# Patient Record
Sex: Male | Born: 1970 | Race: Black or African American | Hispanic: No | Marital: Single | Smoking: Current every day smoker
Health system: Southern US, Community
[De-identification: ages and names within clinical notes are randomized; demographics above are authoritative.]

---

## 1997-12-25 ENCOUNTER — Emergency Department (HOSPITAL_COMMUNITY): Admission: EM | Admit: 1997-12-25 | Discharge: 1997-12-25 | Payer: Self-pay | Admitting: Emergency Medicine

## 2014-11-12 ENCOUNTER — Emergency Department (INDEPENDENT_AMBULATORY_CARE_PROVIDER_SITE_OTHER)
Admission: EM | Admit: 2014-11-12 | Discharge: 2014-11-12 | Disposition: A | Discharge status: Home / Self Care | Payer: Self-pay | Source: Home / Self Care | Attending: Family Medicine | Admitting: Family Medicine

## 2014-11-12 ENCOUNTER — Encounter (HOSPITAL_COMMUNITY): Payer: Self-pay | Admitting: *Deleted

## 2014-11-12 DIAGNOSIS — M5431 Sciatica, right side: Secondary | ICD-10-CM

## 2014-11-12 DIAGNOSIS — M62830 Muscle spasm of back: Secondary | ICD-10-CM

## 2014-11-12 MED ORDER — METHOCARBAMOL 500 MG PO TABS: 500.0000 mg | ORAL_TABLET | Freq: Four times a day (QID) | ORAL | Status: DC | PRN

## 2014-11-12 MED ORDER — PREDNISONE 20 MG PO TABS
50.0000 mg | ORAL_TABLET | Freq: Once | ORAL | Status: AC
  Administered 2014-11-12: 50 mg via ORAL

## 2014-11-12 MED ORDER — PREDNISONE 10 MG PO TABS
ORAL_TABLET | ORAL | Status: AC
  Filled 2014-11-12: qty 1

## 2014-11-12 MED ORDER — HYDROCODONE-ACETAMINOPHEN 5-325 MG PO TABS
1.0000 | ORAL_TABLET | Freq: Once | ORAL | Status: AC
  Administered 2014-11-12: 1 via ORAL

## 2014-11-12 MED ORDER — PREDNISONE 10 MG PO KIT: PACK | ORAL | Status: DC

## 2014-11-12 MED ORDER — TRAMADOL HCL 50 MG PO TABS: 100.0000 mg | ORAL_TABLET | Freq: Once | ORAL | Status: DC

## 2014-11-12 MED ORDER — PREDNISONE 20 MG PO TABS
ORAL_TABLET | ORAL | Status: AC
  Filled 2014-11-12: qty 2

## 2014-11-12 MED ORDER — HYDROCODONE-ACETAMINOPHEN 5-325 MG PO TABS
ORAL_TABLET | ORAL | Status: AC
  Filled 2014-11-12: qty 1

## 2014-11-12 NOTE — ED Provider Notes (Signed)
CSN: [id number]     Arrival date & time 11/12/14  1837 History   First MD Initiated Contact with Patient 11/12/14 1907     Chief Complaint  Patient presents with  . Back Pain   (Consider location/radiation/quality/duration/timing/severity/associated sxs/prior Treatment) HPI  Back pain: started 4 days ago. Getting worse. Tight feeling. Lower back. bilat R>L. Aleve 4 daily w/ minimal improvement. No changei n exercise routine or recent heavy lifting. Pain is constant. Worse w/ certain movements. Denies loss of bowel or bladder fxn. Mild R leg weakness.  Denies fevers, chest pain, shortness of breath, palpitation, nausea, vomiting, diarrhea, abdominal pain, dysuria, frequency, falls, loss of consciousness, headache.  History reviewed. No pertinent past medical history. History reviewed. No pertinent past surgical history. Family History  Problem Relation Age of Onset  . Asthma Mother   . Heart disease Father    History  Substance Use Topics  . Smoking status: Current Every Day Smoker -- 0.75 packs/day    Types: Cigarettes  . Smokeless tobacco: Not on file  . Alcohol Use: Yes     Comment: socially    Review of Systems Per HPI with all other pertinent systems negative.   Allergies  Review of patient's allergies indicates no known allergies.  Home Medications   Prior to Admission medications   Medication Sig Start Date End Date Taking? Authorizing Provider  naproxen sodium (ANAPROX) 220 MG tablet Take 440 mg by mouth 2 (two) times daily with a meal.   Yes Historical Provider, MD  methocarbamol (ROBAXIN) 500 MG tablet Take 1-2 tablets (500-1,000 mg total) by mouth every 6 (six) hours as needed for muscle spasms. 11/12/14   Waldemar Dickens, MD  PredniSONE 10 MG KIT 12 day dose pack 11/12/14   Waldemar Dickens, MD   BP 123/83 mmHg  Pulse 74  Temp(Src) 98.3 F (36.8 C) (Oral)  Resp 16  SpO2 99% Physical Exam Physical Exam  Constitutional: oriented to person, place, and time.  appears well-developed and well-nourished. No distress.  HENT:  Head: Normocephalic and atraumatic.  Eyes: EOMI. PERRL.  Neck: Normal range of motion.  Cardiovascular: RRR, no m/r/g, 2+ distal pulses,  Pulmonary/Chest: Effort normal and breath sounds normal. No respiratory distress.  Abdominal: Soft. Bowel sounds are normal. NonTTP, no distension.  Musculoskeletal: Back FROM. R and L perispinal muscle tightness and ttp.  Neurological: alert and oriented to person, place, and time.  Skin: Skin is warm. No rash noted. non diaphoretic.  Psychiatric: normal mood and affect. behavior is normal. Judgment and thought content normal.   ED Course  Procedures (including critical care time) Labs Review Labs Reviewed - No data to display  Imaging Review No results found.   MDM   1. Back spasm   2. Sciatica, right    Bactrim 5 through 25 and penicillin 50 mg given in office. Start sciatica exercises, prednisone Dosepak, heat, massage. Patient aware that if he gets significantly worse he is scheduled to the emergency room immediately. Patient follow-up with his primary care physician if he is not better in 2 weeks for further management.   Waldemar Dickens, MD 11/12/14 [phone]

## 2014-11-12 NOTE — Discharge Instructions (Signed)
You likely have sciatica. This will require exercises, time and anti-inflammatory medications to resolve.  Please go to the emergency room if you get significantly worse Please follow up with your regular doctor in 2 weeks if you are not better.  Please take the other medications as prescribed.   Sciatica with Rehab The sciatic nerve runs from the back down the leg and is responsible for sensation and control of the muscles in the back (posterior) side of the thigh, lower leg, and foot. Sciatica is a condition that is characterized by inflammation of this nerve.  SYMPTOMS   Signs of nerve damage, including numbness and/or weakness along the posterior side of the lower extremity.  Pain in the back of the thigh that may also travel down the leg.  Pain that worsens when sitting for long periods of time.  Occasionally, pain in the back or buttock. CAUSES  Inflammation of the sciatic nerve is the cause of sciatica. The inflammation is due to something irritating the nerve. Common sources of irritation include:  Sitting for long periods of time.  Direct trauma to the nerve.  Arthritis of the spine.  Herniated or ruptured disk.  Slipping of the vertebrae (spondylolisthesis).  Pressure from soft tissues, such as muscles or ligament-like tissue (fascia). RISK INCREASES WITH:  Sports that place pressure or stress on the spine (football or weightlifting).  Poor strength and flexibility.  Failure to warm up properly before activity.  Family history of low back pain or disk disorders.  Previous back injury or surgery.  Poor body mechanics, especially when lifting, or poor posture. PREVENTION   Warm up and stretch properly before activity.  Maintain physical fitness:  Strength, flexibility, and endurance.  Cardiovascular fitness.  Learn and use proper technique, especially with posture and lifting. When possible, have coach correct improper technique.  Avoid activities that  place stress on the spine. PROGNOSIS If treated properly, then sciatica usually resolves within 6 weeks. However, occasionally surgery is necessary.  RELATED COMPLICATIONS   Permanent nerve damage, including pain, numbness, tingle, or weakness.  Chronic back pain.  Risks of surgery: infection, bleeding, nerve damage, or damage to surrounding tissues. TREATMENT Treatment initially involves resting from any activities that aggravate your symptoms. The use of ice and medication may help reduce pain and inflammation. The use of strengthening and stretching exercises may help reduce pain with activity. These exercises may be performed at home or with referral to a therapist. A therapist may recommend further treatments, such as transcutaneous electronic nerve stimulation (TENS) or ultrasound. Your caregiver may recommend corticosteroid injections to help reduce inflammation of the sciatic nerve. If symptoms persist despite non-surgical (conservative) treatment, then surgery may be recommended. MEDICATION  If pain medication is necessary, then nonsteroidal anti-inflammatory medications, such as aspirin and ibuprofen, or other minor pain relievers, such as acetaminophen, are often recommended.  Do not take pain medication for 7 days before surgery.  Prescription pain relievers may be given if deemed necessary by your caregiver. Use only as directed and only as much as you need.  Ointments applied to the skin may be helpful.  Corticosteroid injections may be given by your caregiver. These injections should be reserved for the most serious cases, because they may only be given a certain number of times. HEAT AND COLD  Cold treatment (icing) relieves pain and reduces inflammation. Cold treatment should be applied for 10 to 15 minutes every 2 to 3 hours for inflammation and pain and immediately after any activity that  aggravates your symptoms. Use ice packs or massage the area with a piece of ice (ice  massage).  Heat treatment may be used prior to performing the stretching and strengthening activities prescribed by your caregiver, physical therapist, or athletic trainer. Use a heat pack or soak the injury in warm water. SEEK MEDICAL CARE IF:  Treatment seems to offer no benefit, or the condition worsens.  Any medications produce adverse side effects. EXERCISES  RANGE OF MOTION (ROM) AND STRETCHING EXERCISES - Sciatica Most people with sciatic will find that their symptoms worsen with either excessive bending forward (flexion) or arching at the low back (extension). The exercises which will help resolve your symptoms will focus on the opposite motion. Your physician, physical therapist or athletic trainer will help you determine which exercises will be most helpful to resolve your low back pain. Do not complete any exercises without first consulting with your clinician. Discontinue any exercises which worsen your symptoms until you speak to your clinician. If you have pain, numbness or tingling which travels down into your buttocks, leg or foot, the goal of the therapy is for these symptoms to move closer to your back and eventually resolve. Occasionally, these leg symptoms will get better, but your low back pain may worsen; this is typically an indication of progress in your rehabilitation. Be certain to be very alert to any changes in your symptoms and the activities in which you participated in the 24 hours prior to the change. Sharing this information with your clinician will allow him/her to most efficiently treat your condition. These exercises may help you when beginning to rehabilitate your injury. Your symptoms may resolve with or without further involvement from your physician, physical therapist or athletic trainer. While completing these exercises, remember:   Restoring tissue flexibility helps normal motion to return to the joints. This allows healthier, less painful movement and  activity.  An effective stretch should be held for at least 30 seconds.  A stretch should never be painful. You should only feel a gentle lengthening or release in the stretched tissue. FLEXION RANGE OF MOTION AND STRETCHING EXERCISES: STRETCH - Flexion, Single Knee to Chest   Lie on a firm bed or floor with both legs extended in front of you.  Keeping one leg in contact with the floor, bring your opposite knee to your chest. Hold your leg in place by either grabbing behind your thigh or at your knee.  Pull until you feel a gentle stretch in your low back. Hold __________ seconds.  Slowly release your grasp and repeat the exercise with the opposite side. Repeat __________ times. Complete this exercise __________ times per day.  STRETCH - Flexion, Double Knee to Chest  Lie on a firm bed or floor with both legs extended in front of you.  Keeping one leg in contact with the floor, bring your opposite knee to your chest.  Tense your stomach muscles to support your back and then lift your other knee to your chest. Hold your legs in place by either grabbing behind your thighs or at your knees.  Pull both knees toward your chest until you feel a gentle stretch in your low back. Hold __________ seconds.  Tense your stomach muscles and slowly return one leg at a time to the floor. Repeat __________ times. Complete this exercise __________ times per day.  STRETCH - Low Trunk Rotation   Lie on a firm bed or floor. Keeping your legs in front of you, bend your knees  so they are both pointed toward the ceiling and your feet are flat on the floor.  Extend your arms out to the side. This will stabilize your upper body by keeping your shoulders in contact with the floor.  Gently and slowly drop both knees together to one side until you feel a gentle stretch in your low back. Hold for __________ seconds.  Tense your stomach muscles to support your low back as you bring your knees back to the  starting position. Repeat the exercise to the other side. Repeat __________ times. Complete this exercise __________ times per day  EXTENSION RANGE OF MOTION AND FLEXIBILITY EXERCISES: STRETCH - Extension, Prone on Elbows  Lie on your stomach on the floor, a bed will be too soft. Place your palms about shoulder width apart and at the height of your head.  Place your elbows under your shoulders. If this is too painful, stack pillows under your chest.  Allow your body to relax so that your hips drop lower and make contact more completely with the floor.  Hold this position for __________ seconds.  Slowly return to lying flat on the floor. Repeat __________ times. Complete this exercise __________ times per day.  RANGE OF MOTION - Extension, Prone Press Ups  Lie on your stomach on the floor, a bed will be too soft. Place your palms about shoulder width apart and at the height of your head.  Keeping your back as relaxed as possible, slowly straighten your elbows while keeping your hips on the floor. You may adjust the placement of your hands to maximize your comfort. As you gain motion, your hands will come more underneath your shoulders.  Hold this position __________ seconds.  Slowly return to lying flat on the floor. Repeat __________ times. Complete this exercise __________ times per day.  STRENGTHENING EXERCISES - Sciatica  These exercises may help you when beginning to rehabilitate your injury. These exercises should be done near your "sweet spot." This is the neutral, low-back arch, somewhere between fully rounded and fully arched, that is your least painful position. When performed in this safe range of motion, these exercises can be used for people who have either a flexion or extension based injury. These exercises may resolve your symptoms with or without further involvement from your physician, physical therapist or athletic trainer. While completing these exercises, remember:    Muscles can gain both the endurance and the strength needed for everyday activities through controlled exercises.  Complete these exercises as instructed by your physician, physical therapist or athletic trainer. Progress with the resistance and repetition exercises only as your caregiver advises.  You may experience muscle soreness or fatigue, but the pain or discomfort you are trying to eliminate should never worsen during these exercises. If this pain does worsen, stop and make certain you are following the directions exactly. If the pain is still present after adjustments, discontinue the exercise until you can discuss the trouble with your clinician. STRENGTHENING - Deep Abdominals, Pelvic Tilt   Lie on a firm bed or floor. Keeping your legs in front of you, bend your knees so they are both pointed toward the ceiling and your feet are flat on the floor.  Tense your lower abdominal muscles to press your low back into the floor. This motion will rotate your pelvis so that your tail bone is scooping upwards rather than pointing at your feet or into the floor.  With a gentle tension and even breathing, hold this position for  __________ seconds. Repeat __________ times. Complete this exercise __________ times per day.  STRENGTHENING - Abdominals, Crunches   Lie on a firm bed or floor. Keeping your legs in front of you, bend your knees so they are both pointed toward the ceiling and your feet are flat on the floor. Cross your arms over your chest.  Slightly tip your chin down without bending your neck.  Tense your abdominals and slowly lift your trunk high enough to just clear your shoulder blades. Lifting higher can put excessive stress on the low back and does not further strengthen your abdominal muscles.  Control your return to the starting position. Repeat __________ times. Complete this exercise __________ times per day.  STRENGTHENING - Quadruped, Opposite UE/LE Lift  Assume a  hands and knees position on a firm surface. Keep your hands under your shoulders and your knees under your hips. You may place padding under your knees for comfort.  Find your neutral spine and gently tense your abdominal muscles so that you can maintain this position. Your shoulders and hips should form a rectangle that is parallel with the floor and is not twisted.  Keeping your trunk steady, lift your right hand no higher than your shoulder and then your left leg no higher than your hip. Make sure you are not holding your breath. Hold this position __________ seconds.  Continuing to keep your abdominal muscles tense and your back steady, slowly return to your starting position. Repeat with the opposite arm and leg. Repeat __________ times. Complete this exercise __________ times per day.  STRENGTHENING - Abdominals and Quadriceps, Straight Leg Raise   Lie on a firm bed or floor with both legs extended in front of you.  Keeping one leg in contact with the floor, bend the other knee so that your foot can rest flat on the floor.  Find your neutral spine, and tense your abdominal muscles to maintain your spinal position throughout the exercise.  Slowly lift your straight leg off the floor about 6 inches for a count of 15, making sure to not hold your breath.  Still keeping your neutral spine, slowly lower your leg all the way to the floor. Repeat this exercise with each leg __________ times. Complete this exercise __________ times per day. POSTURE AND BODY MECHANICS CONSIDERATIONS - Sciatica Keeping correct posture when sitting, standing or completing your activities will reduce the stress put on different body tissues, allowing injured tissues a chance to heal and limiting painful experiences. The following are general guidelines for improved posture. Your physician or physical therapist will provide you with any instructions specific to your needs. While reading these guidelines,  remember:  The exercises prescribed by your provider will help you have the flexibility and strength to maintain correct postures.  The correct posture provides the optimal environment for your joints to work. All of your joints have less wear and tear when properly supported by a spine with good posture. This means you will experience a healthier, less painful body.  Correct posture must be practiced with all of your activities, especially prolonged sitting and standing. Correct posture is as important when doing repetitive low-stress activities (typing) as it is when doing a single heavy-load activity (lifting). RESTING POSITIONS Consider which positions are most painful for you when choosing a resting position. If you have pain with flexion-based activities (sitting, bending, stooping, squatting), choose a position that allows you to rest in a less flexed posture. You would want to avoid curling into  a fetal position on your side. If your pain worsens with extension-based activities (prolonged standing, working overhead), avoid resting in an extended position such as sleeping on your stomach. Most people will find more comfort when they rest with their spine in a more neutral position, neither too rounded nor too arched. Lying on a non-sagging bed on your side with a pillow between your knees, or on your back with a pillow under your knees will often provide some relief. Keep in mind, being in any one position for a prolonged period of time, no matter how correct your posture, can still lead to stiffness. PROPER SITTING POSTURE In order to minimize stress and discomfort on your spine, you must sit with correct posture Sitting with good posture should be effortless for a healthy body. Returning to good posture is a gradual process. Many people can work toward this most comfortably by using various supports until they have the flexibility and strength to maintain this posture on their own. When sitting  with proper posture, your ears will fall over your shoulders and your shoulders will fall over your hips. You should use the back of the chair to support your upper back. Your low back will be in a neutral position, just slightly arched. You may place a small pillow or folded towel at the base of your low back for support.  When working at a desk, create an environment that supports good, upright posture. Without extra support, muscles fatigue and lead to excessive strain on joints and other tissues. Keep these recommendations in mind: CHAIR:   A chair should be able to slide under your desk when your back makes contact with the back of the chair. This allows you to work closely.  The chair's height should allow your eyes to be level with the upper part of your monitor and your hands to be slightly lower than your elbows. BODY POSITION  Your feet should make contact with the floor. If this is not possible, use a foot rest.  Keep your ears over your shoulders. This will reduce stress on your neck and low back. INCORRECT SITTING POSTURES   If you are feeling tired and unable to assume a healthy sitting posture, do not slouch or slump. This puts excessive strain on your back tissues, causing more damage and pain. Healthier options include:  Using more support, like a lumbar pillow.  Switching tasks to something that requires you to be upright or walking.  Talking a brief walk.  Lying down to rest in a neutral-spine position. PROLONGED STANDING WHILE SLIGHTLY LEANING FORWARD  When completing a task that requires you to lean forward while standing in one place for a long time, place either foot up on a stationary 2-4 inch high object to help maintain the best posture. When both feet are on the ground, the low back tends to lose its slight inward curve. If this curve flattens (or becomes too large), then the back and your other joints will experience too much stress, fatigue more quickly and can  cause pain.  CORRECT STANDING POSTURES Proper standing posture should be assumed with all daily activities, even if they only take a few moments, like when brushing your teeth. As in sitting, your ears should fall over your shoulders and your shoulders should fall over your hips. You should keep a slight tension in your abdominal muscles to brace your spine. Your tailbone should point down to the ground, not behind your body, resulting in an over-extended  swayback posture.  INCORRECT STANDING POSTURES  Common incorrect standing postures include a forward head, locked knees and/or an excessive swayback. WALKING Walk with an upright posture. Your ears, shoulders and hips should all line-up. PROLONGED ACTIVITY IN A FLEXED POSITION When completing a task that requires you to bend forward at your waist or lean over a low surface, try to find a way to stabilize 3 of 4 of your limbs. You can place a hand or elbow on your thigh or rest a knee on the surface you are reaching across. This will provide you more stability so that your muscles do not fatigue as quickly. By keeping your knees relaxed, or slightly bent, you will also reduce stress across your low back. CORRECT LIFTING TECHNIQUES DO :   Assume a wide stance. This will provide you more stability and the opportunity to get as close as possible to the object which you are lifting.  Tense your abdominals to brace your spine; then bend at the knees and hips. Keeping your back locked in a neutral-spine position, lift using your leg muscles. Lift with your legs, keeping your back straight.  Test the weight of unknown objects before attempting to lift them.  Try to keep your elbows locked down at your sides in order get the best strength from your shoulders when carrying an object.  Always ask for help when lifting heavy or awkward objects. INCORRECT LIFTING TECHNIQUES DO NOT:   Lock your knees when lifting, even if it is a small object.  Bend and  twist. Pivot at your feet or move your feet when needing to change directions.  Assume that you cannot safely pick up a paperclip without proper posture. Document Released: 07/21/2005 Document Revised: 12/05/2013 Document Reviewed: 11/02/2008 [facility] Patient Information 2015 [place], [place]. This information is not intended to replace advice given to you by your health care provider. Make sure you discuss any questions you have with your health care provider.

## 2014-11-12 NOTE — ED Notes (Addendum)
Woke Wed morning with low back pain.  He worked on [address] [employer]. Pain was worse on Friday and felt like he could not get his breath and felt like he was going vomit.  He said he could not get up.  He tried rest, Aleve and hot and cold compresses.

## 2014-11-12 NOTE — ED Notes (Signed)
Work note done as Dr. Konrad DoloresMerrell said and given to pt.

## 2018-12-26 ENCOUNTER — Emergency Department (HOSPITAL_COMMUNITY)
Admission: EM | Admit: 2018-12-26 | Discharge: 2018-12-26 | Disposition: A | Discharge status: Home / Self Care | Payer: Self-pay | Attending: Emergency Medicine | Admitting: Emergency Medicine

## 2018-12-26 ENCOUNTER — Other Ambulatory Visit: Payer: Self-pay

## 2018-12-26 ENCOUNTER — Encounter (HOSPITAL_COMMUNITY): Payer: Self-pay | Admitting: Emergency Medicine

## 2018-12-26 DIAGNOSIS — F17210 Nicotine dependence, cigarettes, uncomplicated: Secondary | ICD-10-CM | POA: Insufficient documentation

## 2018-12-26 DIAGNOSIS — Z79899 Other long term (current) drug therapy: Secondary | ICD-10-CM | POA: Insufficient documentation

## 2018-12-26 DIAGNOSIS — M62830 Muscle spasm of back: Secondary | ICD-10-CM | POA: Insufficient documentation

## 2018-12-26 DIAGNOSIS — M545 Low back pain: Secondary | ICD-10-CM | POA: Insufficient documentation

## 2018-12-26 DIAGNOSIS — M79604 Pain in right leg: Secondary | ICD-10-CM

## 2018-12-26 MED ORDER — METHOCARBAMOL 500 MG PO TABS: 500.0000 mg | ORAL_TABLET | Freq: Two times a day (BID) | ORAL | 0 refills | Status: DC

## 2018-12-26 MED ORDER — NAPROXEN 500 MG PO TABS: 500.0000 mg | ORAL_TABLET | Freq: Two times a day (BID) | ORAL | 0 refills | Status: DC

## 2018-12-26 MED ORDER — KETOROLAC TROMETHAMINE 60 MG/2ML IM SOLN
60.0000 mg | Freq: Once | INTRAMUSCULAR | Status: AC
Start: 2018-12-26 — End: 2018-12-26
  Administered 2018-12-26: 60 mg via INTRAMUSCULAR
  Filled 2018-12-26: qty 2

## 2018-12-26 MED ORDER — METHOCARBAMOL 500 MG PO TABS
500.0000 mg | ORAL_TABLET | Freq: Once | ORAL | Status: AC
  Administered 2018-12-26: 500 mg via ORAL
  Filled 2018-12-26: qty 1

## 2018-12-26 MED ORDER — OXYCODONE-ACETAMINOPHEN 5-325 MG PO TABS
1.0000 | ORAL_TABLET | Freq: Once | ORAL | Status: AC
  Administered 2018-12-26: 04:00:00 1 via ORAL
  Filled 2018-12-26: qty 1

## 2018-12-26 NOTE — Discharge Instructions (Signed)
Take the prescribed medication as directed.  Can use heat on the back to help with tension as well. Follow-up with your primary care doctor. Return to the ED for new or worsening symptoms.

## 2018-12-26 NOTE — ED Provider Notes (Signed)
[facility] DEPT Provider Note   CSN: [id number] Arrival date & time: 12/26/18  0319    History   Chief Complaint Chief Complaint  Patient presents with  . Back Pain    HPI John Sexton is a 48 y.o. male.     The history is provided by the patient and medical records.     49 year old male here with right lower back pain.  Reports it feels tight with "spasms".  He reports some radiation of pain into his hip pain groin.  He denies any injury, trauma, or falls.  He reports similar episode 4 years ago but generally does not have any issues with his back.  He denies any numbness or weakness of the legs.  No bowel or bladder incontinence.  He did take Aleve at home with no improvement.  History reviewed. No pertinent past medical history.  There are no active problems to display for this patient.   History reviewed. No pertinent surgical history.      Home Medications    Prior to Admission medications   Medication Sig Start Date End Date Taking? Authorizing Provider  methocarbamol (ROBAXIN) 500 MG tablet Take 1-2 tablets (500-1,000 mg total) by mouth every 6 (six) hours as needed for muscle spasms. 11/12/14   Waldemar Dickens, MD  naproxen sodium (ANAPROX) 220 MG tablet Take 440 mg by mouth 2 (two) times daily with a meal.    [provider]  PredniSONE 10 MG KIT 12 day dose pack 11/12/14   Waldemar Dickens, MD    Family History Family History  Problem Relation Age of Onset  . Asthma Mother   . Heart disease Father     Social History Social History   Tobacco Use  . Smoking status: Current Every Day Smoker    Packs/day: 1.00    Types: Cigarettes  . Smokeless tobacco: Never Used  Substance Use Topics  . Alcohol use: Yes    Comment: socially  . Drug use: Yes    Types: Methamphetamines, Marijuana     Allergies   Patient has no known allergies.   Review of Systems Review of Systems  Musculoskeletal: Positive for back  pain.  All other systems reviewed and are negative.    Physical Exam Updated Vital Signs BP (!) 132/92 (BP Location: Right Arm)   Pulse 63   Temp 98.3 F (36.8 C) (Oral)   Resp 15   Ht 5' 6"  (1.676 m)   Wt 63.5 kg   SpO2 96%   BMI 22.60 kg/m   Physical Exam Vitals signs and nursing note reviewed.  Constitutional:      Appearance: He is well-developed.     Comments: Appears uncomfortable  HENT:     Head: Normocephalic and atraumatic.  Eyes:     Conjunctiva/sclera: Conjunctivae normal.     Pupils: Pupils are equal, round, and reactive to light.  Neck:     Musculoskeletal: Normal range of motion.  Cardiovascular:     Rate and Rhythm: Normal rate and regular rhythm.     Heart sounds: Normal heart sounds.  Pulmonary:     Effort: Pulmonary effort is normal.     Breath sounds: Normal breath sounds.  Abdominal:     General: Bowel sounds are normal.     Palpations: Abdomen is soft.  Musculoskeletal: Normal range of motion.     Comments: Tenderness of right lumbar paraspinal musculature with spasm present, there is no midline step-off or deformity, pain with  movement/changing position; DP pulse intact bilaterally; moving toes normally, distal sensation intact  Skin:    General: Skin is warm and dry.  Neurological:     Mental Status: He is alert and oriented to person, place, and time.      ED Treatments / Results  Labs (all labs ordered are listed, but only abnormal results are displayed) Labs Reviewed - No data to display  EKG None  Radiology No results found.  Procedures Procedures (including critical care time)  Medications Ordered in ED Medications  ketorolac (TORADOL) injection 60 mg (60 mg Intramuscular Given 12/26/18 0404)  oxyCODONE-acetaminophen (PERCOCET/ROXICET) 5-325 MG per tablet 1 tablet (1 tablet Oral Given 12/26/18 0403)  methocarbamol (ROBAXIN) tablet 500 mg (500 mg Oral Given 12/26/18 0403)     Initial Impression / Assessment and Plan / ED  Course  I have reviewed the triage vital signs and the nursing notes.  Pertinent labs & imaging results that were available during my care of the patient were reviewed by me and considered in my medical decision making (see chart for details).  48 year old M here with right-sided low back pain.  No reported injury, trauma, or falls.  He does appear uncomfortable on exam.  Tenderness of the right lumbar paraspinal musculature with spasm present.  There is no midline step-off or deformity.  He is not have any focal neurologic deficits concerning for cauda equina.  Do not feel he needs emergent imaging at this time.  Will give analgesia and reassess.  4:49 AM Pain much improved at this time.  Patient able to lay on right side.  He states pain 3/10 compared to 9/10 when he arrived.  He feels comfortable and can rest at home.  Recommend rest, heat, meds PRN.  Can follow-up with PCP.  Work note given.  Return here for any new/acute changes.  Final Clinical Impressions(s) / ED Diagnoses   Final diagnoses:  Low back pain radiating to right lower extremity    ED Discharge Orders         Ordered    methocarbamol (ROBAXIN) 500 MG tablet  2 times daily     12/26/18 0450    naproxen (NAPROSYN) 500 MG tablet  2 times daily with meals     12/26/18 0450           Larene Pickett, PA-C 12/26/18 5859    Merryl Hacker, MD 12/26/18 [phone]

## 2018-12-26 NOTE — ED Triage Notes (Signed)
Pt reports lower back pain with radiation down right leg. Pt denies any injruy

## 2020-10-11 ENCOUNTER — Ambulatory Visit
Admission: EM | Admit: 2020-10-11 | Discharge: 2020-10-11 | Disposition: A | Discharge status: Home / Self Care | Payer: Self-pay | Attending: Emergency Medicine | Admitting: Emergency Medicine

## 2020-10-11 ENCOUNTER — Other Ambulatory Visit: Payer: Self-pay

## 2020-10-11 DIAGNOSIS — M545 Low back pain, unspecified: Secondary | ICD-10-CM

## 2020-10-11 MED ORDER — PREDNISONE 10 MG PO TABS: ORAL_TABLET | ORAL | 0 refills | Status: DC

## 2020-10-11 MED ORDER — TIZANIDINE HCL 4 MG PO TABS: 2.0000 mg | ORAL_TABLET | Freq: Four times a day (QID) | ORAL | 0 refills | Status: DC | PRN

## 2020-10-11 NOTE — ED Triage Notes (Signed)
Pt presents with lower back pain that comes and goes. Pt denies any injury. Reports the pain started Friday. Pt has history of back pain. Reports it has gotten so bad he could barely walk or stand. Denies relief with ibuprofen.

## 2020-10-11 NOTE — ED Provider Notes (Signed)
[name]    CSN: [id number] Arrival date & time: 10/11/20  1307      History   Chief Complaint Chief Complaint  Patient presents with  . Back Pain    HPI John Sexton is a 50 y.o. male presenting today for evaluation of back pain.  Reports pain started on Friday, present over the past 5 days.  Denies any injury or trauma.  Reports history of similar mild flareup at times.  Denies any radiation into extremities.  Denies numbness or tingling.  Denies urinary symptoms.  Using ibuprofen with out full relief.  HPI  History reviewed. No pertinent past medical history.  There are no problems to display for this patient.   History reviewed. No pertinent surgical history.     Home Medications    Prior to Admission medications   Medication Sig Start Date End Date Taking? Authorizing Provider  predniSONE (DELTASONE) 10 MG tablet Begin with 6 tabs on day 1, 5 tab on day 2, 4 tab on day 3, 3 tab on day 4, 2 tab on day 5, 1 tab on day 6-take with food 10/11/20  Yes Makaela Cando C, PA-C  tiZANidine (ZANAFLEX) 4 MG tablet Take 0.5-1 tablets (2-4 mg total) by mouth every 6 (six) hours as needed for muscle spasms. 10/11/20  Yes Layla Kesling, Junius Creamer, PA-C    Family History Family History  Problem Relation Age of Onset  . Asthma Mother   . Heart disease Father     Social History Social History   Tobacco Use  . Smoking status: Current Every Day Smoker    Packs/day: 1.00    Types: Cigarettes  . Smokeless tobacco: Never Used  Substance Use Topics  . Alcohol use: Yes    Comment: socially  . Drug use: Yes    Types: Methamphetamines, Marijuana     Allergies   Patient has no known allergies.   Review of Systems Review of Systems  Constitutional: Negative for fatigue and fever.  Eyes: Negative for redness, itching and visual disturbance.  Respiratory: Negative for shortness of breath.   Cardiovascular: Negative for chest pain and leg swelling.   Gastrointestinal: Negative for nausea and vomiting.  Musculoskeletal: Positive for back pain and myalgias. Negative for arthralgias.  Skin: Negative for color change, rash and wound.  Neurological: Negative for dizziness, syncope, weakness, light-headedness and headaches.     Physical Exam Triage Vital Signs ED Triage Vitals  Enc Vitals Group     BP 10/11/20 1322 (!) 142/98     Pulse Rate 10/11/20 1322 94     Resp 10/11/20 1322 19     Temp 10/11/20 1322 98.7 F (37.1 C)     Temp src --      SpO2 10/11/20 1322 100 %     Weight --      Height --      Head Circumference --      Peak Flow --      Pain Score 10/11/20 1321 9     Pain Loc --      Pain Edu? --      Excl. in GC? --    No data found.  Updated Vital Signs BP (!) 142/98   Pulse 94   Temp 98.7 F (37.1 C)   Resp 19   SpO2 100%   Visual Acuity Right Eye Distance:   Left Eye Distance:   Bilateral Distance:    Right Eye Near:   Left Eye Near:  Bilateral Near:     Physical Exam Vitals and nursing note reviewed.  Constitutional:      Appearance: He is well-developed.     Comments: No acute distress  HENT:     Head: Normocephalic and atraumatic.     Nose: Nose normal.  Eyes:     Conjunctiva/sclera: Conjunctivae normal.  Cardiovascular:     Rate and Rhythm: Normal rate.  Pulmonary:     Effort: Pulmonary effort is normal. No respiratory distress.  Abdominal:     General: There is no distension.  Musculoskeletal:        General: Normal range of motion.     Cervical back: Neck supple.     Comments: Back: Nontender to palpation along lumbar spine midline, tenderness to palpation along right lumbar musculature  Strength at hips knees 5/5 ankle bilaterally, does have some slight weakness 4/5 with resisted hip flexion on the right  Skin:    General: Skin is warm and dry.  Neurological:     Mental Status: He is alert and oriented to person, place, and time.      UC Treatments / Results  Labs (all  labs ordered are listed, but only abnormal results are displayed) Labs Reviewed - No data to display  EKG   Radiology No results found.  Procedures Procedures (including critical care time)  Medications Ordered in UC Medications - No data to display  Initial Impression / Assessment and Plan / UC Course  I have reviewed the triage vital signs and the nursing notes.  Pertinent labs & imaging results that were available during my care of the patient were reviewed by me and considered in my medical decision making (see chart for details).     Right low back pain-no maintenance of injury, deferring imaging.  Using NSAIDs without full relief, will trial prednisone taper and supplement with muscle relaxers, discussed activity modification.  Work note provided.  Discussed strict return precautions. Patient verbalized understanding and is agreeable with plan.  Final Clinical Impressions(s) / UC Diagnoses   Final diagnoses:  Acute right-sided low back pain without sciatica     Discharge Instructions     Begin prednisone taper over the next 6 days-begin with 6 tablets, decrease by 1 tablet each day until complete-6, 5, 4, 3, 2, 1-take with food and early in the day if possible  Supplement with tizanidine which is a muscle relaxer, may cause drowsiness, do not drive or work after taking  Alternate ice and heat  Follow-up if not improving or worsening    ED Prescriptions    Medication Sig Dispense Auth. Provider   predniSONE (DELTASONE) 10 MG tablet Begin with 6 tabs on day 1, 5 tab on day 2, 4 tab on day 3, 3 tab on day 4, 2 tab on day 5, 1 tab on day 6-take with food 21 tablet Grayland Daisey C, PA-C   tiZANidine (ZANAFLEX) 4 MG tablet Take 0.5-1 tablets (2-4 mg total) by mouth every 6 (six) hours as needed for muscle spasms. 30 tablet Lilyian Quayle, [place] C, PA-C     PDMP not reviewed this encounter.   Lew Dawes, [place] 10/11/20 1343

## 2020-10-11 NOTE — Discharge Instructions (Signed)
Begin prednisone taper over the next 6 days-begin with 6 tablets, decrease by 1 tablet each day until complete-6, 5, 4, 3, 2, 1-take with food and early in the day if possible  Supplement with tizanidine which is a muscle relaxer, may cause drowsiness, do not drive or work after taking  Alternate ice and heat  Follow-up if not improving or worsening

## 2021-01-28 ENCOUNTER — Emergency Department (HOSPITAL_COMMUNITY): Payer: Self-pay

## 2021-01-28 ENCOUNTER — Encounter (HOSPITAL_COMMUNITY): Payer: Self-pay | Admitting: Emergency Medicine

## 2021-01-28 ENCOUNTER — Other Ambulatory Visit: Payer: Self-pay

## 2021-01-28 ENCOUNTER — Emergency Department (HOSPITAL_COMMUNITY)
Admission: EM | Admit: 2021-01-28 | Discharge: 2021-01-28 | Disposition: A | Discharge status: Home / Self Care | Payer: Self-pay | Attending: Emergency Medicine | Admitting: Emergency Medicine

## 2021-01-28 DIAGNOSIS — S81051A Open bite, right knee, initial encounter: Secondary | ICD-10-CM | POA: Insufficient documentation

## 2021-01-28 DIAGNOSIS — W540XXA Bitten by dog, initial encounter: Secondary | ICD-10-CM | POA: Insufficient documentation

## 2021-01-28 DIAGNOSIS — Z23 Encounter for immunization: Secondary | ICD-10-CM | POA: Insufficient documentation

## 2021-01-28 DIAGNOSIS — F17210 Nicotine dependence, cigarettes, uncomplicated: Secondary | ICD-10-CM | POA: Insufficient documentation

## 2021-01-28 MED ORDER — AMOXICILLIN-POT CLAVULANATE 875-125 MG PO TABS: 1.0000 | ORAL_TABLET | Freq: Two times a day (BID) | ORAL | 0 refills | Status: DC

## 2021-01-28 MED ORDER — TETANUS-DIPHTH-ACELL PERTUSSIS 5-2.5-18.5 LF-MCG/0.5 IM SUSY
0.5000 mL | PREFILLED_SYRINGE | Freq: Once | INTRAMUSCULAR | Status: AC
  Administered 2021-01-28: 0.5 mL via INTRAMUSCULAR
  Filled 2021-01-28: qty 0.5

## 2021-01-28 NOTE — ED Notes (Signed)
RN cleaned wound, applied bacitracin, and gauze dressing.

## 2021-01-28 NOTE — ED Notes (Signed)
RN reviewed discharge instructions w/ pt. Follow up, wound care, and prescriptions reviewed. Pt had no further questions

## 2021-01-28 NOTE — ED Provider Notes (Signed)
[facility] EMERGENCY DEPARTMENT Provider Note   CSN: [id number] Arrival date & time: 01/28/21  1035     History No chief complaint on file.   John Sexton is a 50 y.o. male.  The history is provided by the patient.  Animal Bite Contact animal:  Dog Location:  Leg Leg injury location:  R knee Time since incident:  1 day Pain details:    Quality:  Aching and sore   Severity:  Moderate   Timing:  Constant   Progression:  Unchanged Incident location:  Home Provoked: provoked   Notifications:  None Animal's rabies vaccination status:  Unknown Animal in possession: yes   Tetanus status:  Out of date Relieved by:  None tried Worsened by:  Activity Ineffective treatments:  None tried Associated symptoms: swelling   Associated symptoms: no fever and no numbness       No past medical history on file.  There are no problems to display for this patient.   No past surgical history on file.     Family History  Problem Relation Age of Onset   Asthma Mother    Heart disease Father     Social History   Tobacco Use   Smoking status: Every Day    Packs/day: 1.00    Pack years: 0.00    Types: Cigarettes   Smokeless tobacco: Never  Substance Use Topics   Alcohol use: Yes    Comment: socially   Drug use: Yes    Types: Methamphetamines, Marijuana    Home Medications Prior to Admission medications   Medication Sig Start Date End Date Taking? Authorizing Provider  predniSONE (DELTASONE) 10 MG tablet Begin with 6 tabs on day 1, 5 tab on day 2, 4 tab on day 3, 3 tab on day 4, 2 tab on day 5, 1 tab on day 6-take with food 10/11/20   Wieters, Hallie C, PA-C  tiZANidine (ZANAFLEX) 4 MG tablet Take 0.5-1 tablets (2-4 mg total) by mouth every 6 (six) hours as needed for muscle spasms. 10/11/20   Wieters, Hallie C, PA-C    Allergies    Patient has no known allergies.  Review of Systems   Review of Systems  Constitutional:  Negative for fever.   Neurological:  Negative for numbness.  All other systems reviewed and are negative.  Physical Exam Updated Vital Signs BP 128/83 (BP Location: Right Arm)   Pulse 86   Temp 98.9 F (37.2 C) (Oral)   Resp 15   SpO2 100%   Physical Exam Vitals and nursing note reviewed.  Constitutional:      General: He is not in acute distress.    Appearance: Normal appearance. He is normal weight.  HENT:     Head: Normocephalic.  Eyes:     Pupils: Pupils are equal, round, and reactive to light.  Cardiovascular:     Rate and Rhythm: Normal rate.     Pulses: Normal pulses.  Pulmonary:     Effort: Pulmonary effort is normal.  Musculoskeletal:        General: Tenderness and signs of injury present.     Right knee: Swelling and bony tenderness present. Normal range of motion. No medial joint line or lateral joint line tenderness.     Right lower leg: No edema.     Left lower leg: No edema.       Legs:     Comments: Full ROM of the right knee.  Injury is proximal to the  patella  Neurological:     Mental Status: He is alert.    ED Results / Procedures / Treatments   Labs (all labs ordered are listed, but only abnormal results are displayed) Labs Reviewed - No data to display  EKG None  Radiology DG Knee Complete 4 Views Right  Result Date: 01/28/2021 CLINICAL DATA:  Dog bite EXAM: RIGHT KNEE - COMPLETE 4+ VIEW COMPARISON:  None. FINDINGS: Frontal, lateral, and bilateral oblique views were obtained. There is no fracture or dislocation. No joint effusion. Joint spaces appear normal. No erosive change. No radiopaque foreign body or soft tissue air. IMPRESSION: No fracture or dislocation. No joint effusion. No appreciable arthropathy. No radiopaque foreign body or soft tissue air. Electronically Signed   By: Bretta Bang III M.D.   On: 01/28/2021 12:03    Procedures Procedures   Medications Ordered in ED Medications  Tdap (BOOSTRIX) injection 0.5 mL (has no administration in time  range)    ED Course  I have reviewed the triage vital signs and the nursing notes.  Pertinent labs & imaging results that were available during my care of the patient were reviewed by me and considered in my medical decision making (see chart for details).    MDM Rules/Calculators/A&P                          Patient with a dog bite to the right distal femur just proximal to the patella.  This occurred yesterday.  His dog bit him when another dog came in and they started fighting.  The dog is in possession.  Unclear if the dog has had its rabies vaccine but is otherwise acting normally.  The bite was provoked due to the other dog.  Patient's tetanus shot was updated here.  Patient will be placed on Augmentin.  Wound was cleaned and dressed.  Plain film negative for any foreign bodies or other acute issues.  Does not appear to have violated the knee joint.  MDM   Amount and/or Complexity of Data Reviewed Tests in the radiology section of CPT: ordered and reviewed Independent visualization of images, tracings, or specimens: yes     Final Clinical Impression(s) / ED Diagnoses Final diagnoses:  Dog bite of right knee, initial encounter    Rx / DC Orders ED Discharge Orders          Ordered    amoxicillin-clavulanate (AUGMENTIN) 875-125 MG tablet  Every 12 hours        01/28/21 1143             Gwyneth Sprout, MD 01/28/21 1536

## 2021-01-28 NOTE — ED Notes (Signed)
Patient transported to X-ray 

## 2021-01-28 NOTE — Discharge Instructions (Addendum)
Clean the area daily with soap and water.  You can apply vasoline and then place the bandage.  Take the antibiotic to prevent infection.  If it starts have severe swelling pain or redness return to the ER.

## 2021-01-28 NOTE — ED Triage Notes (Signed)
Pt here from home with c/o dog bite to the upper knee, from last night unknown if dog is upt o date on shots or tdap

## 2021-03-23 ENCOUNTER — Encounter (HOSPITAL_COMMUNITY): Payer: Self-pay | Admitting: Emergency Medicine

## 2021-03-23 ENCOUNTER — Emergency Department (HOSPITAL_COMMUNITY)
Admission: EM | Admit: 2021-03-23 | Discharge: 2021-03-23 | Disposition: A | Discharge status: Home / Self Care | Payer: Self-pay | Attending: Emergency Medicine | Admitting: Emergency Medicine

## 2021-03-23 ENCOUNTER — Emergency Department (HOSPITAL_COMMUNITY): Payer: Self-pay

## 2021-03-23 DIAGNOSIS — R0602 Shortness of breath: Secondary | ICD-10-CM | POA: Insufficient documentation

## 2021-03-23 DIAGNOSIS — R42 Dizziness and giddiness: Secondary | ICD-10-CM | POA: Insufficient documentation

## 2021-03-23 DIAGNOSIS — R0789 Other chest pain: Secondary | ICD-10-CM | POA: Insufficient documentation

## 2021-03-23 DIAGNOSIS — F17210 Nicotine dependence, cigarettes, uncomplicated: Secondary | ICD-10-CM | POA: Insufficient documentation

## 2021-03-23 LAB — CBC
HCT: 44.5 % (ref 39.0–52.0)
Hemoglobin: 15.6 g/dL (ref 13.0–17.0)
MCH: 31.3 pg (ref 26.0–34.0)
MCHC: 35.1 g/dL (ref 30.0–36.0)
MCV: 89.2 fL (ref 80.0–100.0)
Platelets: 299 10*3/uL (ref 150–400)
RBC: 4.99 MIL/uL (ref 4.22–5.81)
RDW: 15.4 % (ref 11.5–15.5)
WBC: 8.4 10*3/uL (ref 4.0–10.5)
nRBC: 0 % (ref 0.0–0.2)

## 2021-03-23 LAB — BASIC METABOLIC PANEL
Anion gap: 13 (ref 5–15)
BUN: 8 mg/dL (ref 6–20)
CO2: 21 mmol/L — ABNORMAL LOW (ref 22–32)
Calcium: 9.2 mg/dL (ref 8.9–10.3)
Chloride: 98 mmol/L (ref 98–111)
Creatinine, Ser: 0.83 mg/dL (ref 0.61–1.24)
GFR, Estimated: >60 mL/min (ref >60)
Glucose, Bld: 93 mg/dL (ref 70–99)
Potassium: 4 mmol/L (ref 3.5–5.1)
Sodium: 132 mmol/L — ABNORMAL LOW (ref 135–145)

## 2021-03-23 LAB — TROPONIN I (HIGH SENSITIVITY)
Troponin I (High Sensitivity): 3 ng/L (ref <18)
Troponin I (High Sensitivity): <2 ng/L (ref <18)

## 2021-03-23 MED ORDER — ACETAMINOPHEN 500 MG PO TABS
1000.0000 mg | ORAL_TABLET | Freq: Once | ORAL | Status: AC
  Administered 2021-03-23: 1000 mg via ORAL
  Filled 2021-03-23: qty 2

## 2021-03-23 NOTE — ED Provider Notes (Signed)
[facility] EMERGENCY DEPARTMENT Provider Note   CSN: [id number] Arrival date & time: 03/23/21  0865     History Chief Complaint  Patient presents with   Chest Pain    John Sexton is a 50 y.o. male presenting for evaluation of chest pain, shortness of breath, dizziness.  Patient states he woke up this morning, realize he was late for work, and started to feel poorly.  He reports shortness of breath, chest pain, dizziness, worse when he stands up.  The dizziness is described as a lightheadedness.  He reports history of similar symptoms many years ago when he had anxiety/panic attacks.  He has not been felt anxious recently.  He states he tried to continue his normal morning routine and go to work, but continued to feel poorly and thus came to the ER.  He denies fevers, chills, cough, nausea, vomiting, abdominal pain, urinary symptoms, normal bowel movements.  No recent travel, surgeries, immobilization, history of cancer, history previous DVT/PE, or hormone use.  He is on Biktarvy, takes no other medications daily.  No previous cardiac problems.  He smokes cigarettes.  Reports social alcohol use, no increase yesterday.  Remote history of GERD, no recent issues with this.  HPI     History reviewed. No pertinent past medical history.  There are no problems to display for this patient.   History reviewed. No pertinent surgical history.     Family History  Problem Relation Age of Onset   Asthma Mother    Heart disease Father     Social History   Tobacco Use   Smoking status: Every Day    Packs/day: 1.00    Types: Cigarettes   Smokeless tobacco: Never  Substance Use Topics   Alcohol use: Yes    Comment: socially   Drug use: Yes    Types: Methamphetamines, Marijuana    Home Medications Prior to Admission medications   Medication Sig Start Date End Date Taking? Authorizing Provider  amoxicillin-clavulanate (AUGMENTIN) 875-125 MG tablet Take 1 tablet  by mouth every 12 (twelve) hours. 01/28/21   Gwyneth Sprout, MD  predniSONE (DELTASONE) 10 MG tablet Begin with 6 tabs on day 1, 5 tab on day 2, 4 tab on day 3, 3 tab on day 4, 2 tab on day 5, 1 tab on day 6-take with food 10/11/20   Wieters, Hallie C, PA-C  tiZANidine (ZANAFLEX) 4 MG tablet Take 0.5-1 tablets (2-4 mg total) by mouth every 6 (six) hours as needed for muscle spasms. 10/11/20   Wieters, Hallie C, PA-C    Allergies    Patient has no known allergies.  Review of Systems   Review of Systems  Respiratory:  Positive for shortness of breath.   Cardiovascular:  Positive for chest pain.  Neurological:  Positive for dizziness.  All other systems reviewed and are negative.  Physical Exam Updated Vital Signs BP 131/87   Pulse 81   Temp 99 F (37.2 C) (Oral)   Resp 16   SpO2 98%   Physical Exam Vitals and nursing note reviewed.  Constitutional:      General: He is not in acute distress.    Appearance: Normal appearance.     Comments: Resting in the bed in NAD  HENT:     Head: Normocephalic and atraumatic.  Eyes:     Conjunctiva/sclera: Conjunctivae normal.     Pupils: Pupils are equal, round, and reactive to light.  Cardiovascular:     Rate and Rhythm: Normal  rate and regular rhythm.     Pulses: Normal pulses.  Pulmonary:     Effort: Pulmonary effort is normal. No respiratory distress.     Breath sounds: Normal breath sounds. No wheezing.     Comments: Speaking in full sentences.  Clear lung sounds in all fields. Abdominal:     General: There is no distension.     Palpations: Abdomen is soft. There is no mass.     Tenderness: There is no abdominal tenderness. There is no guarding or rebound.  Musculoskeletal:        General: Normal range of motion.     Cervical back: Normal range of motion and neck supple.     Right lower leg: No edema.     Left lower leg: No edema.  Skin:    General: Skin is warm and dry.     Capillary Refill: Capillary refill takes less than 2  seconds.  Neurological:     Mental Status: He is alert and oriented to person, place, and time.  Psychiatric:        Mood and Affect: Mood and affect normal.        Speech: Speech normal.        Behavior: Behavior normal.    ED Results / Procedures / Treatments   Labs (all labs ordered are listed, but only abnormal results are displayed) Labs Reviewed  BASIC METABOLIC PANEL - Abnormal; Notable for the following components:      Result Value   Sodium 132 (*)    CO2 21 (*)    All other components within normal limits  CBC  TROPONIN I (HIGH SENSITIVITY)  TROPONIN I (HIGH SENSITIVITY)    EKG EKG Interpretation  Date/Time:  Saturday March 23 2021 07:52:58 EDT Ventricular Rate:  90 PR Interval:  158 QRS Duration: 90 QT Interval:  350 QTC Calculation: 428 R Axis:   71 Text Interpretation: Normal sinus rhythm Minimal voltage criteria for LVH, may be normal variant ( Sokolow-Lyon ) Borderline ECG No old tracing to compare Confirmed by Jacalyn Lefevre [phone]) on 03/23/2021 8:24:18 AM  Radiology DG Chest 2 View  Result Date: 03/23/2021 CLINICAL DATA:  Chest pain, shortness of breath EXAM: CHEST - 2 VIEW COMPARISON:  None. FINDINGS: The heart size and mediastinal contours are within normal limits. Both lungs are clear. The visualized skeletal structures are unremarkable. IMPRESSION: No active cardiopulmonary disease. Electronically Signed   By: Romona Curls M.D.   On: 03/23/2021 09:14    Procedures Procedures   Medications Ordered in ED Medications  acetaminophen (TYLENOL) tablet 1,000 mg (1,000 mg Oral Given 03/23/21 6010)    ED Course  I have reviewed the triage vital signs and the nursing notes.  Pertinent labs & imaging results that were available during my care of the patient were reviewed by me and considered in my medical decision making (see chart for details).    MDM Rules/Calculators/A&P                           Patient presenting for evaluation of chest pain,  shortness breath, dizziness.  This began after patient woke up and realized he was late for work.  On exam, patient appears nontoxic.  Vital signs are stable.  Chest pain is nonpleuritic, vital signs are stable.  Doubt PE and patient is PERC negative.  Will obtain orthostatics due to lightheadedness worse when standing.  Will obtain cardiac work-up due to chest pain,  although I have low suspicion for ACS.  Chest x-ray to rule out pulmonary cause such as infection.  Based on history, favor anxiety as the cause.   Labs interpreted by me, overall reassuring.  Initial troponin negative.  Electrolytes are stable.  Chest x-ray viewed and independently interpreted by me, no pneumonia pnx, effusion.  EKG is nonischemic.  As pain began this morning, will obtain delta troponin.  Repeat troponin negative.  Discussed findings with patient.  On reevaluation, patient appears nontoxic.  Orthostatic vital signs were negative.  I discussed continued symptomatic treatment Tylenol for pain and importance of hydration.  Encourage follow-up with primary care for recheck of symptoms, resources given.  At this time, patient appears safe for discharge.  Return precautions given.  Patient states he understands and agrees to plan.  Final Clinical Impression(s) / ED Diagnoses Final diagnoses:  Atypical chest pain    Rx / DC Orders ED Discharge Orders     None        Alveria Apley, PA-C 03/23/21 1112    Jacalyn Lefevre, MD 03/23/21 1211

## 2021-03-23 NOTE — Discharge Instructions (Addendum)
Your work-up today was overall reassuring, there are no signs of injury or strain on your heart. Your lung work-up is reassuring. Take Tylenol as needed for pain. Make sure stay well-hydrated water. Call the clinic listed below to establish with a primary care doctor for follow up.  Return to the emergency room with any new, worsening, or concerning symptoms

## 2021-03-23 NOTE — ED Notes (Signed)
Patient transported to XR. 

## 2021-03-23 NOTE — ED Notes (Signed)
Sophia PA at bedside assessing pt at this time.

## 2021-03-23 NOTE — ED Triage Notes (Signed)
Pt woke up late for work and was having pain to center of chest when he woke up with SOB and dizziness.  Denies nausea.

## 2021-12-15 ENCOUNTER — Other Ambulatory Visit: Payer: Self-pay

## 2021-12-15 ENCOUNTER — Encounter (HOSPITAL_BASED_OUTPATIENT_CLINIC_OR_DEPARTMENT_OTHER): Payer: Self-pay | Admitting: Emergency Medicine

## 2021-12-15 ENCOUNTER — Emergency Department (HOSPITAL_BASED_OUTPATIENT_CLINIC_OR_DEPARTMENT_OTHER)
Admission: EM | Admit: 2021-12-15 | Discharge: 2021-12-15 | Disposition: A | Discharge status: Home / Self Care | Payer: No Typology Code available for payment source | Attending: Emergency Medicine | Admitting: Emergency Medicine

## 2021-12-15 ENCOUNTER — Emergency Department (HOSPITAL_BASED_OUTPATIENT_CLINIC_OR_DEPARTMENT_OTHER): Payer: No Typology Code available for payment source | Admitting: Radiology

## 2021-12-15 DIAGNOSIS — Y92410 Unspecified street and highway as the place of occurrence of the external cause: Secondary | ICD-10-CM | POA: Diagnosis not present

## 2021-12-15 DIAGNOSIS — S0990XA Unspecified injury of head, initial encounter: Secondary | ICD-10-CM | POA: Insufficient documentation

## 2021-12-15 DIAGNOSIS — S40022A Contusion of left upper arm, initial encounter: Secondary | ICD-10-CM | POA: Insufficient documentation

## 2021-12-15 DIAGNOSIS — H6122 Impacted cerumen, left ear: Secondary | ICD-10-CM | POA: Diagnosis not present

## 2021-12-15 DIAGNOSIS — S3992XA Unspecified injury of lower back, initial encounter: Secondary | ICD-10-CM | POA: Diagnosis present

## 2021-12-15 DIAGNOSIS — S39012A Strain of muscle, fascia and tendon of lower back, initial encounter: Secondary | ICD-10-CM | POA: Insufficient documentation

## 2021-12-15 MED ORDER — KETOROLAC TROMETHAMINE 60 MG/2ML IM SOLN
60.0000 mg | Freq: Once | INTRAMUSCULAR | Status: AC
  Administered 2021-12-15: 60 mg via INTRAMUSCULAR
  Filled 2021-12-15: qty 2

## 2021-12-15 MED ORDER — METHOCARBAMOL 750 MG PO TABS: 750.0000 mg | ORAL_TABLET | Freq: Four times a day (QID) | ORAL | 0 refills | Status: DC

## 2021-12-15 MED ORDER — IBUPROFEN 800 MG PO TABS: 800.0000 mg | ORAL_TABLET | Freq: Three times a day (TID) | ORAL | 0 refills | Status: DC
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
## 2021-12-15 NOTE — ED Notes (Signed)
Patient transported to X-ray 

## 2022-08-29 ENCOUNTER — Emergency Department (HOSPITAL_BASED_OUTPATIENT_CLINIC_OR_DEPARTMENT_OTHER)
Admission: EM | Admit: 2022-08-29 | Discharge: 2022-08-29 | Disposition: A | Discharge status: Home / Self Care | Payer: Self-pay | Attending: Emergency Medicine | Admitting: Emergency Medicine

## 2022-08-29 ENCOUNTER — Emergency Department (HOSPITAL_BASED_OUTPATIENT_CLINIC_OR_DEPARTMENT_OTHER): Payer: Self-pay

## 2022-08-29 ENCOUNTER — Encounter (HOSPITAL_BASED_OUTPATIENT_CLINIC_OR_DEPARTMENT_OTHER): Payer: Self-pay

## 2022-08-29 ENCOUNTER — Other Ambulatory Visit: Payer: Self-pay

## 2022-08-29 ENCOUNTER — Ambulatory Visit
Admission: EM | Admit: 2022-08-29 | Discharge: 2022-08-29 | Disposition: A | Discharge status: Home / Self Care | Payer: Self-pay | Attending: Internal Medicine | Admitting: Internal Medicine

## 2022-08-29 DIAGNOSIS — R002 Palpitations: Secondary | ICD-10-CM

## 2022-08-29 DIAGNOSIS — R0602 Shortness of breath: Secondary | ICD-10-CM | POA: Insufficient documentation

## 2022-08-29 DIAGNOSIS — R030 Elevated blood-pressure reading, without diagnosis of hypertension: Secondary | ICD-10-CM

## 2022-08-29 DIAGNOSIS — R1013 Epigastric pain: Secondary | ICD-10-CM | POA: Insufficient documentation

## 2022-08-29 LAB — CBC
HCT: 39.1 % (ref 39.0–52.0)
Hemoglobin: 13.8 g/dL (ref 13.0–17.0)
MCH: 30.5 pg (ref 26.0–34.0)
MCHC: 35.3 g/dL (ref 30.0–36.0)
MCV: 86.3 fL (ref 80.0–100.0)
Platelets: 278 10*3/uL (ref 150–400)
RBC: 4.53 MIL/uL (ref 4.22–5.81)
RDW: 14 % (ref 11.5–15.5)
WBC: 5.5 10*3/uL (ref 4.0–10.5)
nRBC: 0 % (ref 0.0–0.2)

## 2022-08-29 LAB — TROPONIN I (HIGH SENSITIVITY)
Troponin I (High Sensitivity): 2 ng/L (ref <18)
Troponin I (High Sensitivity): <2 ng/L (ref <18)

## 2022-08-29 LAB — BASIC METABOLIC PANEL
Anion gap: 9 (ref 5–15)
BUN: 12 mg/dL (ref 6–20)
CO2: 24 mmol/L (ref 22–32)
Calcium: 8.4 mg/dL — ABNORMAL LOW (ref 8.9–10.3)
Chloride: 102 mmol/L (ref 98–111)
Creatinine, Ser: 1 mg/dL (ref 0.61–1.24)
GFR, Estimated: >60 mL/min (ref >60)
Glucose, Bld: 108 mg/dL — ABNORMAL HIGH (ref 70–99)
Potassium: 3.8 mmol/L (ref 3.5–5.1)
Sodium: 135 mmol/L (ref 135–145)

## 2022-08-29 LAB — D-DIMER, QUANTITATIVE: D-Dimer, Quant: <0.27 ug/mL-FEU (ref 0.00–0.50)

## 2022-08-29 NOTE — Discharge Instructions (Signed)
Go straight to the emergency department as soon as you leave urgent care for further evaluation and management. 

## 2022-08-29 NOTE — ED Triage Notes (Signed)
Patient presents to UC for intermittent tachycardia, RUQ pain, SOB since yesterday. States he woke up with HR in the 90's. States he was at work today when he experienced dizziness and tachycardia, HR in the 80's. No hx of HTN or cardiac problems. No changes in meds. Took a dose of ASA 1015. Does have a hx of anxiety.   Denies chest pain, n/v.

## 2022-08-29 NOTE — ED Notes (Signed)
Provider at bedside

## 2022-08-29 NOTE — ED Provider Notes (Signed)
[place] EMERGENCY DEPARTMENT AT [facility] Provider Note   CSN: [id number] Arrival date & time: 08/29/22  1700     History  Chief Complaint  Patient presents with   Palpitations    John Sexton is a 52 y.o. male here presenting with palpitations and tachycardia.  Patient states that he noticed palpitations since yesterday.  Patient states that this is associated with some anxiety.  He reported some epigastric pain with urgent care but denies it with me.  Patient was sent here from urgent care for further evaluation.  Denies any history of blood clots or recent travel.  Denies any cardiac history.  The history is provided by the patient.       Home Medications Prior to Admission medications   Medication Sig Start Date End Date Taking? Authorizing Provider  amoxicillin-clavulanate (AUGMENTIN) 875-125 MG tablet Take 1 tablet by mouth every 12 (twelve) hours. 01/28/21   Blanchie Dessert, MD  ibuprofen (ADVIL) 800 MG tablet Take 1 tablet (800 mg total) by mouth 3 (three) times daily. 12/15/21   Charlesetta Shanks, MD  methocarbamol (ROBAXIN-750) 750 MG tablet Take 1 tablet (750 mg total) by mouth 4 (four) times daily. 12/15/21   Charlesetta Shanks, MD  predniSONE (DELTASONE) 10 MG tablet Begin with 6 tabs on day 1, 5 tab on day 2, 4 tab on day 3, 3 tab on day 4, 2 tab on day 5, 1 tab on day 6-take with food 10/11/20   Wieters, Hallie C, PA-C  tiZANidine (ZANAFLEX) 4 MG tablet Take 0.5-1 tablets (2-4 mg total) by mouth every 6 (six) hours as needed for muscle spasms. 10/11/20   Wieters, Hallie C, PA-C      Allergies    Patient has no known allergies.    Review of Systems   Review of Systems  Cardiovascular:  Positive for palpitations.  All other systems reviewed and are negative.   Physical Exam Updated Vital Signs BP 119/84   Pulse 64   Temp 98 F (36.7 C) (Oral)   Resp 12   Wt 69.4 kg   SpO2 98%   BMI 24.69 kg/m  Physical Exam Vitals and nursing note reviewed.   Constitutional:      Appearance: Normal appearance.  HENT:     Head: Normocephalic.     Nose: Nose normal.     Mouth/Throat:     Mouth: Mucous membranes are moist.  Eyes:     Extraocular Movements: Extraocular movements intact.     Pupils: Pupils are equal, round, and reactive to light.  Cardiovascular:     Rate and Rhythm: Normal rate and regular rhythm.     Pulses: Normal pulses.     Heart sounds: Normal heart sounds.  Pulmonary:     Effort: Pulmonary effort is normal.     Breath sounds: Normal breath sounds.  Abdominal:     General: Abdomen is flat.     Palpations: Abdomen is soft.  Musculoskeletal:        General: Normal range of motion.     Cervical back: Normal range of motion and neck supple.  Skin:    General: Skin is warm.     Capillary Refill: Capillary refill takes less than 2 seconds.  Neurological:     General: No focal deficit present.     Mental Status: He is alert and oriented to person, place, and time.  Psychiatric:        Mood and Affect: Mood normal.  Behavior: Behavior normal.     ED Results / Procedures / Treatments   Labs (all labs ordered are listed, but only abnormal results are displayed) Labs Reviewed  BASIC METABOLIC PANEL - Abnormal; Notable for the following components:      Result Value   Glucose, Bld 108 (*)    Calcium 8.4 (*)    All other components within normal limits  CBC  D-DIMER, QUANTITATIVE  TROPONIN I (HIGH SENSITIVITY)  TROPONIN I (HIGH SENSITIVITY)    EKG EKG Interpretation  Date/Time:  Friday August 29 2022 17:30:04 EST Ventricular Rate:  77 PR Interval:  172 QRS Duration: 91 QT Interval:  365 QTC Calculation: 413 R Axis:   66 Text Interpretation: Sinus rhythm Left atrial enlargement Probable left ventricular hypertrophy No significant change since last tracing Confirmed by Wandra Arthurs [id number]) on 08/29/2022 8:18:09 PM  Radiology DG Chest 2 View  Result Date: 08/29/2022 CLINICAL DATA:  Shortness of  breath EXAM: CHEST - 2 VIEW COMPARISON:  03/23/2021 FINDINGS: The heart size and mediastinal contours are within normal limits. Both lungs are clear. The visualized skeletal structures are unremarkable. IMPRESSION: No active cardiopulmonary disease. Electronically Signed   By: Davina Poke D.O.   On: 08/29/2022 17:57    Procedures Procedures    Medications Ordered in ED Medications - No data to display  ED Course/ Medical Decision Making/ A&P                             Medical Decision Making John Sexton is a 52 y.o. male here presenting with chest pain and palpitations.  Patient has some anxiety.  He was noted to have heart rate in the low 100s in urgent care.  However his heart rate here is in the 70s.  I have low suspicion for PE so we will get a D-dimer. Low suspicion for ACS and will get troponin x 2.   11:13 PM I reviewed patient's labs and independently interpreted imaging studies.  D-dimer is negative.  Troponin negative x 2.  Stable for discharge.  If he has persistent palpitations, he can get a Holter monitor with cardiology outpatient.  Stable for discharge   Problems Addressed: Palpitations: acute illness or injury  Amount and/or Complexity of Data Reviewed Labs: ordered. Decision-making details documented in ED Course. Radiology: ordered and independent interpretation performed. Decision-making details documented in ED Course. ECG/medicine tests: ordered and independent interpretation performed. Decision-making details documented in ED Course.    Final Clinical Impression(s) / ED Diagnoses Final diagnoses:  None    Rx / DC Orders ED Discharge Orders     None         Drenda Freeze, MD 08/29/22 2315

## 2022-08-29 NOTE — ED Provider Notes (Signed)
[name]    CSN: [id number] Arrival date & time: 08/29/22  1548      History   Chief Complaint Chief Complaint  Patient presents with   Tachycardia   Shortness of Breath    HPI John Sexton is a 52 y.o. male.   Patient presents with feelings of palpitations, right upper quadrant abdominal pain, and shortness of breath that started yesterday.  Patient reports that all of the symptoms are intermittent.  He denies chest pain, headache, blurred vision, nausea, vomiting but does report some intermittent dizziness.  Patient denies any history of cardiac problems but does report history of anxiety.  Abdominal pain is located in the right upper quadrant, is described as a cramping pain, is rated 7/10 on pain scale.  Patient denies any diarrhea.  Denies blood in stool.   Shortness of Breath   History reviewed. No pertinent past medical history.  There are no problems to display for this patient.   History reviewed. No pertinent surgical history.     Home Medications    Prior to Admission medications   Medication Sig Start Date End Date Taking? Authorizing Provider  amoxicillin-clavulanate (AUGMENTIN) 875-125 MG tablet Take 1 tablet by mouth every 12 (twelve) hours. 01/28/21   Blanchie Dessert, MD  ibuprofen (ADVIL) 800 MG tablet Take 1 tablet (800 mg total) by mouth 3 (three) times daily. 12/15/21   Charlesetta Shanks, MD  methocarbamol (ROBAXIN-750) 750 MG tablet Take 1 tablet (750 mg total) by mouth 4 (four) times daily. 12/15/21   Charlesetta Shanks, MD  predniSONE (DELTASONE) 10 MG tablet Begin with 6 tabs on day 1, 5 tab on day 2, 4 tab on day 3, 3 tab on day 4, 2 tab on day 5, 1 tab on day 6-take with food 10/11/20   Wieters, Hallie C, PA-C  tiZANidine (ZANAFLEX) 4 MG tablet Take 0.5-1 tablets (2-4 mg total) by mouth every 6 (six) hours as needed for muscle spasms. 10/11/20   Wieters, Elesa Hacker, PA-C    Family History Family History  Problem Relation Age of Onset    Asthma Mother    Heart disease Father     Social History Social History   Tobacco Use   Smoking status: Every Day    Packs/day: 0.50    Types: Cigarettes   Smokeless tobacco: Never  Vaping Use   Vaping Use: Never used  Substance Use Topics   Alcohol use: Yes    Comment: socially   Drug use: Yes    Types: Methamphetamines, Marijuana     Allergies   Patient has no known allergies.   Review of Systems Review of Systems Per HPI  Physical Exam Triage Vital Signs ED Triage Vitals  Enc Vitals Group     BP 08/29/22 1607 (!) 178/102     Pulse Rate 08/29/22 1607 91     Resp 08/29/22 1607 18     Temp 08/29/22 1607 98.5 F (36.9 C)     Temp Source 08/29/22 1607 Oral     SpO2 08/29/22 1607 96 %     Weight --      Height --      Head Circumference --      Peak Flow --      Pain Score 08/29/22 1608 5     Pain Loc --      Pain Edu? --      Excl. in [place]? --    No data found.  Updated Vital Signs BP Marland Kitchen)  178/102 (BP Location: Left Arm)   Pulse 91   Temp 98.5 F (36.9 C) (Oral)   Resp 18   SpO2 96%   Visual Acuity Right Eye Distance:   Left Eye Distance:   Bilateral Distance:    Right Eye Near:   Left Eye Near:    Bilateral Near:     Physical Exam Constitutional:      General: He is not in acute distress.    Appearance: Normal appearance. He is not toxic-appearing or diaphoretic.  HENT:     Head: Normocephalic and atraumatic.  Eyes:     Extraocular Movements: Extraocular movements intact.     Conjunctiva/sclera: Conjunctivae normal.     Pupils: Pupils are equal, round, and reactive to light.  Cardiovascular:     Rate and Rhythm: Normal rate. Rhythm irregular.     Pulses: Normal pulses.     Heart sounds: Normal heart sounds.  Pulmonary:     Effort: Pulmonary effort is normal. No respiratory distress.     Breath sounds: Normal breath sounds.  Abdominal:     General: Bowel sounds are normal. There is no distension.     Palpations: Abdomen is soft.      Tenderness: There is no abdominal tenderness.  Neurological:     General: No focal deficit present.     Mental Status: He is alert and oriented to person, place, and time. Mental status is at baseline.     Cranial Nerves: Cranial nerves 2-12 are intact.     Sensory: Sensation is intact.     Motor: Motor function is intact.     Coordination: Coordination is intact.     Gait: Gait is intact.  Psychiatric:        Mood and Affect: Mood normal.        Behavior: Behavior normal.        Thought Content: Thought content normal.        Judgment: Judgment normal.      UC Treatments / Results  Labs (all labs ordered are listed, but only abnormal results are displayed) Labs Reviewed - No data to display  EKG   Radiology No results found.  Procedures Procedures (including critical care time)  Medications Ordered in UC Medications - No data to display  Initial Impression / Assessment and Plan / UC Course  I have reviewed the triage vital signs and the nursing notes.  Pertinent labs & imaging results that were available during my care of the patient were reviewed by me and considered in my medical decision making (see chart for details).     EKG completed that showed T wave abnormality which is changed from previous EKG.  Patient's heart rate on physical exam also sounds irregular at times.  Although, no regular rhythm noted on EKG.  Given palpitations and physical exam as well as associated elevated blood pressure reading, patient was advised that he will need to go to the ER for further evaluation and management.  He was agreeable with plan.  Suggested EMS transport but he declined.  Risk associated with not going by EMS were discussed with patient.  Patient voiced understanding and accepted risks.  Patient left via self transport and he was advised to go straight to the ER. Final Clinical Impressions(s) / UC Diagnoses   Final diagnoses:  Palpitations  Shortness of breath   Elevated blood pressure reading     Discharge Instructions      Go straight to the emergency department as  soon as you leave urgent care for further evaluation and management.    ED Prescriptions   None    PDMP not reviewed this encounter.   John Sexton, [place] 08/29/22 [phone]

## 2022-08-29 NOTE — ED Notes (Signed)
Patient is being discharged from the Urgent Care and sent to the Emergency Department via POV with family . Per [place], NP, patient is in need of higher level of care due to tachycardia. Patient is aware and verbalizes understanding of plan of care.  Vitals:   08/29/22 1607  BP: (!) 178/102  Pulse: 91  Resp: 18  Temp: 98.5 F (36.9 C)  SpO2: 96%

## 2022-08-29 NOTE — Discharge Instructions (Signed)
As we discussed, your workup was normal in the ED.  In particular your heart enzymes are normal and you do not have a blood clot  If you have persistent palpitations, I recommend that you follow-up with cardiology  Return to ER if you have shortness of breath or palpitation with chest pain

## 2022-08-29 NOTE — ED Triage Notes (Addendum)
Pt ambulatory to triage. Sent by UC due to palpitations and tachycardia. Pt states feels fast beats at times and makes him feel SOB  and SOB with exertion. Denies abdominal pain at this time

## 2022-09-03 ENCOUNTER — Ambulatory Visit: Payer: Self-pay | Attending: Internal Medicine | Admitting: Internal Medicine

## 2022-09-03 ENCOUNTER — Ambulatory Visit (INDEPENDENT_AMBULATORY_CARE_PROVIDER_SITE_OTHER): Payer: Self-pay

## 2022-09-03 ENCOUNTER — Encounter: Payer: Self-pay | Admitting: Internal Medicine

## 2022-09-03 VITALS — BP 128/84 | HR 82 | Ht 66.0 in | Wt 154.0 lb

## 2022-09-03 DIAGNOSIS — R002 Palpitations: Secondary | ICD-10-CM

## 2022-09-03 NOTE — Patient Instructions (Signed)
Medication Instructions:  No changes *If you need a refill on your cardiac medications before your next appointment, please call your pharmacy*  Testing/Procedures: Your physician has recommended that you wear a 3 DAY ZIO-PATCH monitor. The Zio patch cardiac monitor continuously records heart rhythm data for up to 14 days, this is for patients being evaluated for multiple types heart rhythms. For the first 24 hours post application, please avoid getting the Zio monitor wet in the shower or by excessive sweating during exercise. After that, feel free to carry on with regular activities. Keep soaps and lotions away from the ZIO XT Patch.  This will be mailed to you, please expect 7-10 days to receive.    Applying the monitor   Shave hair from upper left chest.   Hold abrader disc by orange tab.  Rub abrader in 40 strokes over left upper chest as indicated in your monitor instructions.   Clean area with 4 enclosed alcohol pads .  Use all pads to assure are is cleaned thoroughly.  Let dry.   Apply patch as indicated in monitor instructions.  Patch will be place under collarbone on left side of chest with arrow pointing upward.   Rub patch adhesive wings for 2 minutes.Remove white label marked "1".  Remove white label marked "2".  Rub patch adhesive wings for 2 additional minutes.   While looking in a mirror, press and release button in center of patch.  A small green light will flash 3-4 times .  This will be your only indicator the monitor has been turned on.     Do not shower for the first 24 hours.  You may shower after the first 24 hours.   Press button if you feel a symptom. You will hear a small click.  Record Date, Time and Symptom in the Patient Log Book.   When you are ready to remove patch, follow instructions on last 2 pages of Patient Log Book.  Stick patch monitor onto last page of Patient Log Book.   Place Patient Log Book in [place] box.  Use locking tab on box and tape box closed  securely.  The [employer] has [employer] on it.  Please place in mailbox as soon as possible.  Your physician should have your test results approximately 7 days after the monitor has been mailed back to [facility].   Call [facility] at [phone] if you have questions regarding your ZIO XT patch monitor.  Call them immediately if you see an orange light blinking on your monitor.   If your monitor falls off in less than 4 days contact our Monitor department at [phone].  If your monitor becomes loose or falls off after 4 days call [employer] at [phone] for suggestions on securing your monitor    Follow-Up: At [facility], you and your health needs are our priority.  As part of our continuing mission to provide you with exceptional heart care, we have created designated Provider Care Teams.  These Care Teams include your primary Cardiologist (physician) and Advanced Practice Providers (APPs -  Physician Assistants and Nurse Practitioners) who all work together to provide you with the care you need, when you need it.  We recommend signing up for the patient portal called "MyChart".  Sign up information is provided on this After Visit Summary.  MyChart is used to connect with patients for Virtual Visits (Telemedicine).  Patients are able to view lab/test results, encounter notes, upcoming  appointments, etc.  Non-urgent messages can be sent to your provider as well.   To learn more about what you can do with MyChart, go to [email or web address].    Your next appointment:    Follow up as needed  Provider:   Dr Harl Bowie

## 2022-09-03 NOTE — Progress Notes (Unsigned)
Cardiology Office Note:    Date:  09/03/2022   ID:  Lacretia Nicks, DOB 1971/04/19, MRN [id number]  PCP:  Pcp, No   [facility] Providers Cardiologist:  None { Click to update primary MD,subspecialty MD or APP then REFRESH:1}    Referring MD: Drenda Freeze, MD   Chief Complaint  Patient presents with   New Patient (Initial Visit)   Palpitations    History of Present Illness:    John Sexton is a 52 y.o. male no significant PMHx. No cardiac dx hx. There was concern for anxiety. EKG showed normal sinus rhythm. Mother had heart disease. Father had heart disease. Smoking cigarettes.  Cut back caffeine. He walks with his dogs. No Cp or SOB  Current Medications: Current Meds  Medication Sig   [DISCONTINUED] amoxicillin-clavulanate (AUGMENTIN) 875-125 MG tablet Take 1 tablet by mouth every 12 (twelve) hours.   [DISCONTINUED] ibuprofen (ADVIL) 800 MG tablet Take 1 tablet (800 mg total) by mouth 3 (three) times daily.   [DISCONTINUED] methocarbamol (ROBAXIN-750) 750 MG tablet Take 1 tablet (750 mg total) by mouth 4 (four) times daily.   [DISCONTINUED] predniSONE (DELTASONE) 10 MG tablet Begin with 6 tabs on day 1, 5 tab on day 2, 4 tab on day 3, 3 tab on day 4, 2 tab on day 5, 1 tab on day 6-take with food   [DISCONTINUED] tiZANidine (ZANAFLEX) 4 MG tablet Take 0.5-1 tablets (2-4 mg total) by mouth every 6 (six) hours as needed for muscle spasms.     Allergies:   Patient has no known allergies.   Social History   Socioeconomic History   Marital status: Single    Spouse name: Not on file   Number of children: Not on file   Years of education: Not on file   Highest education level: Not on file  Occupational History   Not on file  Tobacco Use   Smoking status: Every Day    Packs/day: 0.50    Types: Cigarettes   Smokeless tobacco: Never  Vaping Use   Vaping Use: Never used  Substance and Sexual Activity   Alcohol use: Yes    Comment: socially   Drug use:  Not Currently    Types: Methamphetamines, Marijuana   Sexual activity: Not on file  Other Topics Concern   Not on file  Social History Narrative   Not on file   Social Determinants of Health   Financial Resource Strain: Not on file  Food Insecurity: Not on file  Transportation Needs: Not on file  Physical Activity: Not on file  Stress: Not on file  Social Connections: Not on file     Family History: The patient's family history includes Asthma in his mother; Heart disease in his father.  ROS:   Please see the history of present illness.     All other systems reviewed and are negative.  EKGs/Labs/Other Studies Reviewed:    The following studies were reviewed today:   EKG:  EKG is  ordered today.  The ekg ordered today demonstrates   09/03/2022-NSR  Recent Labs: 08/29/2022: BUN 12; Creatinine, Ser 1.00; Hemoglobin 13.8; Platelets 278; Potassium 3.8; Sodium 135  Recent Lipid Panel No results found for: "CHOL", "TRIG", "HDL", "CHOLHDL", "VLDL", "LDLCALC", "LDLDIRECT"   Risk Assessment/Calculations:     Physical Exam:    VS:  BP 128/84 (BP Location: Right Arm, Patient Position: Sitting, Cuff Size: Normal)   Pulse 82   Ht 5\' 6"  (1.676 m)   Wt 154  lb (69.9 kg)   BMI 24.86 kg/m     Wt Readings from Last 3 Encounters:  09/03/22 154 lb (69.9 kg)  08/29/22 153 lb (69.4 kg)  12/15/21 153 lb (69.4 kg)     GEN:  Well nourished, well developed in no acute distress HEENT: Normal NECK: No JVD; No carotid bruits LYMPHATICS: No lymphadenopathy CARDIAC: RRR, no murmurs, rubs, gallops RESPIRATORY:  Clear to auscultation without rales, wheezing or rhonchi  ABDOMEN: Soft, non-tender, non-distended MUSCULOSKELETAL:  No edema; No deformity  SKIN: Warm and dry NEUROLOGIC:  Alert and oriented x 3 PSYCHIATRIC:  Normal affect   ASSESSMENT:    1. Palpitations   - 3 day ziopatch  PLAN:    In order of problems listed above:  3 day ziopatch Follow up prn Recommend PCP  and management for anxiety           Medication Adjustments/Labs and Tests Ordered: Current medicines are reviewed at length with the patient today.  Concerns regarding medicines are outlined above.  Orders Placed This Encounter  Procedures   EKG 12-Lead   No orders of the defined types were placed in this encounter.   There are no Patient Instructions on file for this visit.   Signed, Janina Mayo, MD  09/03/2022 4:46 PM    [place]

## 2022-09-03 NOTE — Progress Notes (Unsigned)
Enrolled patient for a 3 day Zio XT monitor to be mailed to patients home  

## 2022-09-08 DIAGNOSIS — R002 Palpitations: Secondary | ICD-10-CM
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
